# Patient Record
Sex: Female | Born: 1980 | Hispanic: Yes | Marital: Single | Smoking: Former smoker
Health system: Southern US, Community
[De-identification: ages and names within clinical notes are randomized; demographics above are authoritative.]

---

## 2004-09-28 ENCOUNTER — Emergency Department: Payer: Self-pay | Admitting: Emergency Medicine

## 2004-09-29 ENCOUNTER — Ambulatory Visit: Payer: Self-pay | Admitting: Emergency Medicine

## 2005-02-19 ENCOUNTER — Observation Stay: Payer: Self-pay

## 2005-03-22 ENCOUNTER — Observation Stay: Payer: Self-pay | Admitting: Obstetrics and Gynecology

## 2005-04-03 ENCOUNTER — Observation Stay: Payer: Self-pay

## 2005-04-10 ENCOUNTER — Observation Stay: Payer: Self-pay | Admitting: Obstetrics and Gynecology

## 2005-12-08 ENCOUNTER — Emergency Department: Payer: Self-pay | Admitting: Internal Medicine

## 2006-06-27 ENCOUNTER — Emergency Department: Payer: Self-pay | Admitting: Emergency Medicine

## 2006-08-03 ENCOUNTER — Ambulatory Visit: Payer: Self-pay | Admitting: Internal Medicine

## 2006-08-25 ENCOUNTER — Encounter: Payer: Self-pay | Admitting: Family Medicine

## 2006-08-31 ENCOUNTER — Encounter: Payer: Self-pay | Admitting: Family Medicine

## 2006-09-03 ENCOUNTER — Emergency Department: Payer: Self-pay | Admitting: Emergency Medicine

## 2007-11-22 ENCOUNTER — Ambulatory Visit: Payer: Self-pay | Admitting: Family Medicine

## 2008-03-01 ENCOUNTER — Emergency Department: Payer: Self-pay | Admitting: Emergency Medicine

## 2008-03-01 ENCOUNTER — Other Ambulatory Visit: Payer: Self-pay

## 2008-03-20 ENCOUNTER — Emergency Department: Payer: Self-pay | Admitting: Emergency Medicine

## 2008-10-09 ENCOUNTER — Emergency Department: Payer: Self-pay | Admitting: Emergency Medicine

## 2009-07-09 ENCOUNTER — Emergency Department: Payer: Self-pay | Admitting: Emergency Medicine

## 2009-11-01 ENCOUNTER — Emergency Department: Payer: Self-pay | Admitting: Internal Medicine

## 2010-02-13 ENCOUNTER — Emergency Department: Payer: Self-pay | Admitting: Unknown Physician Specialty

## 2010-03-06 ENCOUNTER — Emergency Department: Payer: Self-pay | Admitting: Emergency Medicine

## 2010-06-06 ENCOUNTER — Ambulatory Visit: Payer: Self-pay | Admitting: Unknown Physician Specialty

## 2010-06-17 ENCOUNTER — Ambulatory Visit: Payer: Self-pay | Admitting: Unknown Physician Specialty

## 2010-06-18 LAB — PATHOLOGY REPORT

## 2010-06-20 ENCOUNTER — Emergency Department: Payer: Self-pay | Admitting: Emergency Medicine

## 2010-06-23 ENCOUNTER — Emergency Department: Payer: Self-pay | Admitting: Unknown Physician Specialty

## 2010-11-25 ENCOUNTER — Emergency Department: Payer: Self-pay | Admitting: Emergency Medicine

## 2011-02-17 ENCOUNTER — Emergency Department: Payer: Self-pay | Admitting: Emergency Medicine

## 2011-03-09 ENCOUNTER — Encounter: Payer: Self-pay | Admitting: Family Medicine

## 2011-04-02 ENCOUNTER — Encounter: Payer: Self-pay | Admitting: Family Medicine

## 2011-11-02 ENCOUNTER — Emergency Department: Payer: Self-pay | Admitting: Unknown Physician Specialty

## 2011-11-05 ENCOUNTER — Ambulatory Visit: Payer: Self-pay | Admitting: Internal Medicine

## 2012-02-23 ENCOUNTER — Emergency Department: Payer: Self-pay | Admitting: *Deleted

## 2013-10-26 ENCOUNTER — Emergency Department: Payer: Self-pay | Admitting: Emergency Medicine

## 2016-01-09 ENCOUNTER — Other Ambulatory Visit: Payer: Self-pay | Admitting: Family Medicine

## 2016-01-10 ENCOUNTER — Other Ambulatory Visit: Payer: Self-pay | Admitting: Family Medicine

## 2016-01-10 DIAGNOSIS — R102 Pelvic and perineal pain: Secondary | ICD-10-CM

## 2016-01-13 ENCOUNTER — Ambulatory Visit
Admission: RE | Admit: 2016-01-13 | Discharge: 2016-01-13 | Disposition: A | Discharge status: Home / Self Care | Payer: BLUE CROSS/BLUE SHIELD | Source: Ambulatory Visit | Attending: Family Medicine | Admitting: Family Medicine

## 2016-01-13 DIAGNOSIS — R938 Abnormal findings on diagnostic imaging of other specified body structures: Secondary | ICD-10-CM | POA: Diagnosis not present

## 2016-01-13 DIAGNOSIS — R102 Pelvic and perineal pain: Secondary | ICD-10-CM

## 2016-01-13 DIAGNOSIS — R1032 Left lower quadrant pain: Secondary | ICD-10-CM | POA: Insufficient documentation

## 2016-06-10 ENCOUNTER — Other Ambulatory Visit: Payer: Self-pay | Admitting: Family Medicine

## 2016-06-10 DIAGNOSIS — Z3481 Encounter for supervision of other normal pregnancy, first trimester: Secondary | ICD-10-CM

## 2016-06-10 DIAGNOSIS — R102 Pelvic and perineal pain: Secondary | ICD-10-CM

## 2016-06-19 ENCOUNTER — Ambulatory Visit
Admission: RE | Admit: 2016-06-19 | Discharge: 2016-06-19 | Disposition: A | Discharge status: Home / Self Care | Payer: Self-pay | Source: Ambulatory Visit | Attending: Family Medicine | Admitting: Family Medicine

## 2016-06-19 DIAGNOSIS — R102 Pelvic and perineal pain: Secondary | ICD-10-CM

## 2016-06-19 DIAGNOSIS — Z3481 Encounter for supervision of other normal pregnancy, first trimester: Secondary | ICD-10-CM | POA: Insufficient documentation

## 2016-06-19 DIAGNOSIS — Z3A11 11 weeks gestation of pregnancy: Secondary | ICD-10-CM | POA: Insufficient documentation

## 2017-05-12 ENCOUNTER — Emergency Department: Payer: Medicaid Other

## 2017-05-12 ENCOUNTER — Emergency Department
Admission: EM | Admit: 2017-05-12 | Discharge: 2017-05-12 | Disposition: A | Discharge status: Home / Self Care | Payer: Medicaid Other | Attending: Emergency Medicine | Admitting: Emergency Medicine

## 2017-05-12 ENCOUNTER — Other Ambulatory Visit: Payer: Self-pay

## 2017-05-12 ENCOUNTER — Encounter: Payer: Self-pay | Admitting: Emergency Medicine

## 2017-05-12 DIAGNOSIS — Y929 Unspecified place or not applicable: Secondary | ICD-10-CM | POA: Insufficient documentation

## 2017-05-12 DIAGNOSIS — Y9301 Activity, walking, marching and hiking: Secondary | ICD-10-CM | POA: Insufficient documentation

## 2017-05-12 DIAGNOSIS — S8391XA Sprain of unspecified site of right knee, initial encounter: Secondary | ICD-10-CM

## 2017-05-12 DIAGNOSIS — W000XXA Fall on same level due to ice and snow, initial encounter: Secondary | ICD-10-CM | POA: Insufficient documentation

## 2017-05-12 DIAGNOSIS — W19XXXA Unspecified fall, initial encounter: Secondary | ICD-10-CM

## 2017-05-12 DIAGNOSIS — Y990 Civilian activity done for income or pay: Secondary | ICD-10-CM | POA: Insufficient documentation

## 2017-05-12 DIAGNOSIS — S8991XA Unspecified injury of right lower leg, initial encounter: Secondary | ICD-10-CM | POA: Diagnosis present

## 2017-05-12 MED ORDER — IBUPROFEN 600 MG PO TABS: 600.0000 mg | ORAL_TABLET | Freq: Three times a day (TID) | ORAL | 0 refills | Status: DC | PRN

## 2017-05-12 MED ORDER — IBUPROFEN 600 MG PO TABS
600.0000 mg | ORAL_TABLET | Freq: Once | ORAL | Status: AC
  Administered 2017-05-12: 600 mg via ORAL
  Filled 2017-05-12: qty 1

## 2017-05-12 NOTE — ED Provider Notes (Signed)
[facility] Emergency Department Provider Note  ____________________________________________   First MD Initiated Contact with Patient 05/12/17 [phone]     (approximate)  I have reviewed the triage vital signs and the nursing notes.   HISTORY  Chief Complaint Fall   HPI Valerie Maldonado is a 36 y.o. female who comes to the emergency department with moderate severity sudden onset right knee pain that began when she was at work slipped on ice twisted and fell to the ground.  She did not her head.  She also has mild to moderate aching in her right upper back.  No numbness or weakness.  She is able to ambulate.  Her pain is somewhat worse with movement and somewhat improved with rest.  History reviewed. No pertinent past medical history.  There are no active problems to display for this patient.   History reviewed. No pertinent surgical history.  Prior to Admission medications   Medication Sig Start Date End Date Taking? Authorizing Provider  ibuprofen (ADVIL,MOTRIN) 600 MG tablet Take 1 tablet (600 mg total) by mouth every 8 (eight) hours as needed. 05/12/17   Merrily Brittleifenbark, Katasha Riga, MD    Allergies Penicillins  No family history on file.  Social History Social History   Tobacco Use  . Smoking status: Never Smoker  . Smokeless tobacco: Never Used  Substance Use Topics  . Alcohol use: No    Frequency: Never  . Drug use: Not on file    Review of Systems Constitutional: No fever/chills ENT: No sore throat. Cardiovascular: Denies chest pain. Respiratory: Denies shortness of breath. Gastrointestinal: No abdominal pain.  No nausea, no vomiting.  No diarrhea.  No constipation. Musculoskeletal: Positive for back pain. Neurological: Negative for headaches   ____________________________________________   PHYSICAL EXAM:  VITAL SIGNS: ED Triage Vitals  Enc Vitals Group     BP 05/12/17 0258 121/74     Pulse Rate 05/12/17 0258 80     Resp  05/12/17 0258 20     Temp 05/12/17 0258 98 F (36.7 C)     Temp Source 05/12/17 0258 Oral     SpO2 05/12/17 0258 100 %     Weight 05/12/17 0256 240 lb (108.9 kg)     Height 05/12/17 0256 5\' 1"  (1.549 m)     Head Circumference --      Peak Flow --      Pain Score 05/12/17 0256 5     Pain Loc --      Pain Edu? --      Excl. in GC? --     Constitutional: Alert and oriented x4 pleasant cooperative speaks in full sentences no diaphoresis Head: Atraumatic. Nose: No congestion/rhinnorhea. Mouth/Throat: No trismus Neck: No stridor.   Cardiovascular: Regular rate and rhythm Respiratory: Normal respiratory effort.  No retractions. MSK: Abrasion over right knee extensor mechanism intact knee stable no effusion.  No midline back tenderness Neurologic:  Normal speech and language. No gross focal neurologic deficits are appreciated.  Skin:  Skin is warm, dry and intact. No rash noted.    ____________________________________________  LABS (all labs ordered are listed, but only abnormal results are displayed)  Labs Reviewed - No data to display   __________________________________________  EKG   ____________________________________________  RADIOLOGY  X-ray of the knee reviewed by me with no acute disease ____________________________________________   DIFFERENTIAL includes but not limited to  Knee sprain, knee dislocation, knee fracture, back strain   PROCEDURES  Procedure(s) performed: no  Procedures  Critical Care performed: no  Observation: no ____________________________________________   INITIAL IMPRESSION / ASSESSMENT AND PLAN / ED COURSE  Pertinent labs & imaging results that were available during my care of the patient were reviewed by me and considered in my medical decision making (see chart for details).  The patient arrives very well-appearing neurologically intact with abrasion to the right knee.  Knee stable and x-ray negative.  We will treat her  symptomatically with nonsteroidals refer her back to primary care.  She is discharged home in improved condition verbalized understanding and agreement with plan      ____________________________________________   FINAL CLINICAL IMPRESSION(S) / ED DIAGNOSES  Final diagnoses:  Fall, initial encounter  Sprain of right knee, unspecified ligament, initial encounter      NEW MEDICATIONS STARTED DURING THIS VISIT:  This SmartLink is deprecated. Use AVSMEDLIST instead to display the medication list for a patient.   Note:  This document was prepared using Dragon voice recognition software and may include unintentional dictation errors.      Merrily Brittleifenbark, Jailee Jaquez, MD 05/12/17 [phone]

## 2017-05-12 NOTE — ED Notes (Signed)
Worker's Comp Urine drug screen done by this EDT.

## 2017-05-12 NOTE — Discharge Instructions (Signed)
Please take ibuprofen 3 times a day as needed for pain and follow-up with your primary care physician as needed.  Return to the emergency department for any concerns.  It was a pleasure to take care of you today, and thank you for coming to our emergency department.  If you have any questions or concerns before leaving please ask the nurse to grab me and I'm more than happy to go through your aftercare instructions again.  If you were prescribed any opioid pain medication today such as Norco, Vicodin, Percocet, morphine, hydrocodone, or oxycodone please make sure you do not drive when you are taking this medication as it can alter your ability to drive safely.  If you have any concerns once you are home that you are not improving or are in fact getting worse before you can make it to your follow-up appointment, please do not hesitate to call 911 and come back for further evaluation.  Merrily BrittleNeil Anai Lipson, MD  No results found for this or any previous visit. Dg Knee Complete 4 Views Right  Result Date: 05/12/2017 CLINICAL DATA:  Slip and fall injury on ice, with persistent pain. EXAM: RIGHT KNEE - COMPLETE 4+ VIEW COMPARISON:  None. FINDINGS: No evidence of fracture, dislocation, or joint effusion. No evidence of arthropathy or other focal bone abnormality. Soft tissues are unremarkable. IMPRESSION: Negative. Electronically Signed   By: Ellery Plunkaniel R Mitchell M.D.   On: 05/12/2017 03:43

## 2017-05-12 NOTE — ED Triage Notes (Addendum)
Patient ambulatory to triage with steady gait, without difficulty or distress noted; pt reports falling at work on ice ([employer]); abrasion to right knee; c/o pain to lower back and right knee; st muscles feel "tense"; workers comp profile indicates UDS required; EDT, [place] to triage to complete

## 2017-11-08 ENCOUNTER — Emergency Department
Admission: EM | Admit: 2017-11-08 | Discharge: 2017-11-08 | Disposition: A | Discharge status: Home / Self Care | Payer: Medicaid Other | Attending: Emergency Medicine | Admitting: Emergency Medicine

## 2017-11-08 ENCOUNTER — Emergency Department: Payer: Medicaid Other

## 2017-11-08 ENCOUNTER — Other Ambulatory Visit: Payer: Self-pay

## 2017-11-08 DIAGNOSIS — R109 Unspecified abdominal pain: Secondary | ICD-10-CM | POA: Diagnosis not present

## 2017-11-08 DIAGNOSIS — M549 Dorsalgia, unspecified: Secondary | ICD-10-CM | POA: Diagnosis present

## 2017-11-08 DIAGNOSIS — F17210 Nicotine dependence, cigarettes, uncomplicated: Secondary | ICD-10-CM | POA: Diagnosis not present

## 2017-11-08 DIAGNOSIS — B029 Zoster without complications: Secondary | ICD-10-CM | POA: Insufficient documentation

## 2017-11-08 LAB — CBC WITH DIFFERENTIAL/PLATELET
Basophils Absolute: 0 10*3/uL (ref 0–0.1)
Basophils Relative: 0 %
Eosinophils Absolute: 0.2 10*3/uL (ref 0–0.7)
Eosinophils Relative: 2 %
HEMATOCRIT: 43.4 % (ref 35.0–47.0)
Hemoglobin: 14.9 g/dL (ref 12.0–16.0)
LYMPHS ABS: 2.6 10*3/uL (ref 1.0–3.6)
LYMPHS PCT: 27 %
MCH: 30.7 pg (ref 26.0–34.0)
MCHC: 34.3 g/dL (ref 32.0–36.0)
MCV: 89.5 fL (ref 80.0–100.0)
MONO ABS: 0.6 10*3/uL (ref 0.2–0.9)
Monocytes Relative: 6 %
Neutro Abs: 6.3 10*3/uL (ref 1.4–6.5)
Neutrophils Relative %: 65 %
Platelets: 271 10*3/uL (ref 150–440)
RBC: 4.85 MIL/uL (ref 3.80–5.20)
RDW: 13.6 % (ref 11.5–14.5)
WBC: 9.7 10*3/uL (ref 3.6–11.0)

## 2017-11-08 LAB — COMPREHENSIVE METABOLIC PANEL
ALK PHOS: 91 U/L (ref 38–126)
ALT: 19 U/L (ref 14–54)
AST: 20 U/L (ref 15–41)
Albumin: 4.1 g/dL (ref 3.5–5.0)
Anion gap: 6 (ref 5–15)
BUN: 7 mg/dL (ref 6–20)
CALCIUM: 9.1 mg/dL (ref 8.9–10.3)
CHLORIDE: 105 mmol/L (ref 101–111)
CO2: 26 mmol/L (ref 22–32)
CREATININE: 0.53 mg/dL (ref 0.44–1.00)
Glucose, Bld: 112 mg/dL — ABNORMAL HIGH (ref 65–99)
Potassium: 4.2 mmol/L (ref 3.5–5.1)
Sodium: 137 mmol/L (ref 135–145)
TOTAL PROTEIN: 7.6 g/dL (ref 6.5–8.1)
Total Bilirubin: 0.5 mg/dL (ref 0.3–1.2)

## 2017-11-08 LAB — URINALYSIS, COMPLETE (UACMP) WITH MICROSCOPIC
Bilirubin Urine: NEGATIVE
GLUCOSE, UA: NEGATIVE mg/dL
HGB URINE DIPSTICK: NEGATIVE
Ketones, ur: NEGATIVE mg/dL
NITRITE: NEGATIVE
PH: 5 (ref 5.0–8.0)
Protein, ur: NEGATIVE mg/dL
SPECIFIC GRAVITY, URINE: 1.015 (ref 1.005–1.030)

## 2017-11-08 LAB — POCT PREGNANCY, URINE: Preg Test, Ur: NEGATIVE

## 2017-11-08 MED ORDER — HYDROCODONE-ACETAMINOPHEN 5-325 MG PO TABS
2.0000 | ORAL_TABLET | Freq: Once | ORAL | Status: AC
Start: 2017-11-08 — End: 2017-11-08
  Administered 2017-11-08: 2 via ORAL
  Filled 2017-11-08: qty 2

## 2017-11-08 MED ORDER — HYDROCODONE-ACETAMINOPHEN 5-325 MG PO TABS: 1.0000 | ORAL_TABLET | Freq: Four times a day (QID) | ORAL | 0 refills | Status: DC | PRN

## 2017-11-08 MED ORDER — GABAPENTIN 300 MG PO CAPS
900.0000 mg | ORAL_CAPSULE | Freq: Once | ORAL | Status: AC
  Administered 2017-11-08: 900 mg via ORAL
  Filled 2017-11-08: qty 3

## 2017-11-08 MED ORDER — IBUPROFEN 600 MG PO TABS
600.0000 mg | ORAL_TABLET | Freq: Once | ORAL | Status: AC
  Administered 2017-11-08: 600 mg via ORAL
  Filled 2017-11-08: qty 1

## 2017-11-08 MED ORDER — ACYCLOVIR 400 MG PO TABS: 800.0000 mg | ORAL_TABLET | Freq: Every day | ORAL | 0 refills | Status: AC

## 2017-11-08 MED ORDER — ACYCLOVIR 200 MG PO CAPS
800.0000 mg | ORAL_CAPSULE | Freq: Once | ORAL | Status: AC
  Administered 2017-11-08: 800 mg via ORAL
  Filled 2017-11-08: qty 4

## 2017-11-08 NOTE — Discharge Instructions (Signed)
Please take your antiviral medication as prescribed for full 7 days and follow-up with your primary care physician within 2 days for reevaluation.  Return to the emergency department sooner for any new or worsening symptoms such as fevers, chills, worsening pain, or for any other issues whatsoever.  It was a pleasure to take care of you today, and thank you for coming to our emergency department.  If you have any questions or concerns before leaving please ask the nurse to grab me and I'm more than happy to go through your aftercare instructions again.  If you were prescribed any opioid pain medication today such as Norco, Vicodin, Percocet, morphine, hydrocodone, or oxycodone please make sure you do not drive when you are taking this medication as it can alter your ability to drive safely.  If you have any concerns once you are home that you are not improving or are in fact getting worse before you can make it to your follow-up appointment, please do not hesitate to call 911 and come back for further evaluation.  Merrily BrittleNeil Arianis Bowditch, MD  Results for orders placed or performed during the hospital encounter of 11/08/17  Comprehensive metabolic panel  Result Value Ref Range   Sodium 137 135 - 145 mmol/L   Potassium 4.2 3.5 - 5.1 mmol/L   Chloride 105 101 - 111 mmol/L   CO2 26 22 - 32 mmol/L   Glucose, Bld 112 (H) 65 - 99 mg/dL   BUN 7 6 - 20 mg/dL   Creatinine, Ser 0.450.53 0.44 - 1.00 mg/dL   Calcium 9.1 8.9 - 40.910.3 mg/dL   Total Protein 7.6 6.5 - 8.1 g/dL   Albumin 4.1 3.5 - 5.0 g/dL   AST 20 15 - 41 U/L   ALT 19 14 - 54 U/L   Alkaline Phosphatase 91 38 - 126 U/L   Total Bilirubin 0.5 0.3 - 1.2 mg/dL   GFR calc non Af Amer >60 >60 mL/min   GFR calc Af Amer >60 >60 mL/min   Anion gap 6 5 - 15  Urinalysis, Complete w Microscopic  Result Value Ref Range   Color, Urine YELLOW (A) YELLOW   APPearance HAZY (A) CLEAR   Specific Gravity, Urine 1.015 1.005 - 1.030   pH 5.0 5.0 - 8.0   Glucose, UA  NEGATIVE NEGATIVE mg/dL   Hgb urine dipstick NEGATIVE NEGATIVE   Bilirubin Urine NEGATIVE NEGATIVE   Ketones, ur NEGATIVE NEGATIVE mg/dL   Protein, ur NEGATIVE NEGATIVE mg/dL   Nitrite NEGATIVE NEGATIVE   Leukocytes, UA TRACE (A) NEGATIVE   RBC / HPF 0-5 0 - 5 RBC/hpf   WBC, UA 0-5 0 - 5 WBC/hpf   Bacteria, UA RARE (A) NONE SEEN   Squamous Epithelial / LPF 0-5 0 - 5   Mucus PRESENT   CBC with Differential  Result Value Ref Range   WBC 9.7 3.6 - 11.0 K/uL   RBC 4.85 3.80 - 5.20 MIL/uL   Hemoglobin 14.9 12.0 - 16.0 g/dL   HCT 81.143.4 91.435.0 - 78.247.0 %   MCV 89.5 80.0 - 100.0 fL   MCH 30.7 26.0 - 34.0 pg   MCHC 34.3 32.0 - 36.0 g/dL   RDW 95.613.6 21.311.5 - 08.614.5 %   Platelets 271 150 - 440 K/uL   Neutrophils Relative % 65 %   Neutro Abs 6.3 1.4 - 6.5 K/uL   Lymphocytes Relative 27 %   Lymphs Abs 2.6 1.0 - 3.6 K/uL   Monocytes Relative 6 %  Monocytes Absolute 0.6 0.2 - 0.9 K/uL   Eosinophils Relative 2 %   Eosinophils Absolute 0.2 0 - 0.7 K/uL   Basophils Relative 0 %   Basophils Absolute 0.0 0 - 0.1 K/uL  Pregnancy, urine POC  Result Value Ref Range   Preg Test, Ur NEGATIVE NEGATIVE   Ct Renal Stone Study  Result Date: 11/08/2017 CLINICAL DATA:  Right flank pain.  Nausea. EXAM: CT ABDOMEN AND PELVIS WITHOUT CONTRAST TECHNIQUE: Multidetector CT imaging of the abdomen and pelvis was performed following the standard protocol without IV contrast. COMPARISON:  None. FINDINGS: Lower chest: Normal. Hepatobiliary: No focal liver abnormality is seen. No gallstones, gallbladder wall thickening, or biliary dilatation. Pancreas: Unremarkable. No pancreatic ductal dilatation or surrounding inflammatory changes. Spleen: Normal in size without focal abnormality. Adrenals/Urinary Tract: Adrenal glands are unremarkable. Kidneys are normal, without renal calculi, focal lesion, or hydronephrosis. Bladder is unremarkable. Stomach/Bowel: Stomach is within normal limits. Appendix appears normal. No evidence of  bowel wall thickening, distention, or inflammatory changes. Vascular/Lymphatic: No significant vascular findings are present. No enlarged abdominal or pelvic lymph nodes. Reproductive: 16 mm low-density lesion on the left ovary, probably a follicular cyst. Right ovary and uterus appear normal. Other: No free air or free fluid.  No abdominal wall hernia. Musculoskeletal: No acute or significant osseous findings. IMPRESSION: Benign-appearing abdomen and pelvis. Electronically Signed   By: Francene Boyers M.D.   On: 11/08/2017 17:18

## 2017-11-08 NOTE — ED Triage Notes (Addendum)
Pt reports right side pain that radiates into back - pt states that the pain is worse when pushing down (ex. On the gas pedal) and when walking - pt c/o nausea

## 2017-11-08 NOTE — ED Provider Notes (Signed)
[facility] Emergency Department Provider Note  ____________________________________________   First MD Initiated Contact with Patient 11/08/17 1616     (approximate)  I have reviewed the triage vital signs and the nursing notes.   HISTORY  Chief Complaint Flank Pain   HPI Valerie Maldonado is a 37 y.o. female who self presents to the emergency department with roughly 1 day of constant burning aching severe pain that begins in her right mid back wrapping around like a band terminating under her right rib cage.  The pain is completely constant.  Nothing seems to make it better or worse.  No nausea or vomiting.  No diarrhea.  No dysuria frequency or hesitancy.  She has difficulty finding a comfortable position.  She has no history of abdominal surgeries.  It does not particularly itch.  She is noted no rash.  No history of renal stones.  History reviewed. No pertinent past medical history.  There are no active problems to display for this patient.   History reviewed. No pertinent surgical history.  Prior to Admission medications   Medication Sig Start Date End Date Taking? Authorizing Provider  acyclovir (ZOVIRAX) 400 MG tablet Take 2 tablets (800 mg total) by mouth 5 (five) times daily for 7 days. 11/08/17 11/15/17  Merrily Brittle, MD  HYDROcodone-acetaminophen (NORCO) 5-325 MG tablet Take 1 tablet by mouth every 6 (six) hours as needed for up to 15 doses for severe pain. 11/08/17   Merrily Brittle, MD  ibuprofen (ADVIL,MOTRIN) 600 MG tablet Take 1 tablet (600 mg total) by mouth every 8 (eight) hours as needed. 05/12/17   Merrily Brittle, MD    Allergies Penicillins  No family history on file.  Social History Social History   Tobacco Use  . Smoking status: Current Every Day Smoker    Packs/day: 0.50    Types: Cigarettes  . Smokeless tobacco: Never Used  Substance Use Topics  . Alcohol use: Yes    Frequency: Never    Comment: socially  .  Drug use: Never    Review of Systems Constitutional: No fever/chills Eyes: No visual changes. ENT: No sore throat. Cardiovascular: Denies chest pain. Respiratory: Denies shortness of breath. Gastrointestinal: Positive for abdominal pain.  No nausea, no vomiting.  No diarrhea.  No constipation. Genitourinary: Negative for dysuria. Musculoskeletal: Positive for back pain. Skin: Negative for rash. Neurological: Negative for headaches, focal weakness or numbness.   ____________________________________________   PHYSICAL EXAM:  VITAL SIGNS: ED Triage Vitals  Enc Vitals Group     BP 11/08/17 1444 (!) 110/59     Pulse Rate 11/08/17 1444 60     Resp 11/08/17 1444 17     Temp 11/08/17 1444 98.4 F (36.9 C)     Temp Source 11/08/17 1444 Oral     SpO2 11/08/17 1444 98 %     Weight 11/08/17 1445 230 lb (104.3 kg)     Height 11/08/17 1445 5\' 4"  (1.626 m)     Head Circumference --      Peak Flow --      Pain Score 11/08/17 1444 9     Pain Loc --      Pain Edu? --      Excl. in GC? --     Constitutional: Alert and oriented x4 lying on her left side appears quite uncomfortable nontoxic no diaphoresis speaks focally sentences Eyes: PERRL EOMI. Head: Atraumatic. Nose: No congestion/rhinnorhea. Mouth/Throat: No trismus Neck: No stridor.   Cardiovascular: Normal rate,  regular rhythm. Grossly normal heart sounds.  Good peripheral circulation. Respiratory: Normal respiratory effort.  No retractions. Lungs CTAB and moving good air Gastrointestinal: Obese abdomen soft nondistended nontender no rebound or guarding no peritonitis no McBurney's initially Rovsing's no costovertebral tenderness Musculoskeletal: No lower extremity edema   Neurologic:  Normal speech and language. No gross focal neurologic deficits are appreciated. Skin:  Skin is warm, dry and intact. No rash noted. Psychiatric: Mood and affect are normal. Speech and behavior are  normal.    ____________________________________________   DIFFERENTIAL includes but not limited to  Biliary colic, cholecystitis, nephrolithiasis, urinary tract infection, pyelonephritis, shingles ____________________________________________   LABS (all labs ordered are listed, but only abnormal results are displayed)  Labs Reviewed  COMPREHENSIVE METABOLIC PANEL - Abnormal; Notable for the following components:      Result Value   Glucose, Bld 112 (*)    All other components within normal limits  URINALYSIS, COMPLETE (UACMP) WITH MICROSCOPIC - Abnormal; Notable for the following components:   Color, Urine YELLOW (*)    APPearance HAZY (*)    Leukocytes, UA TRACE (*)    Bacteria, UA RARE (*)    All other components within normal limits  CBC WITH DIFFERENTIAL/PLATELET  POCT PREGNANCY, URINE    Lab work reviewed by me with no acute disease __________________________________________  EKG   ____________________________________________  RADIOLOGY  CT scan reviewed by me with no acute disease ____________________________________________   PROCEDURES  Procedure(s) performed: no  Procedures  Critical Care performed: no  Observation: no ____________________________________________   INITIAL IMPRESSION / ASSESSMENT AND PLAN / ED COURSE  Pertinent labs & imaging results that were available during my care of the patient were reviewed by me and considered in my medical decision making (see chart for details).        ________----------------------------------------- 6:12 PM on 11/08/2017 -----------------------------------------  I appreciate that the patient does not have any rash right now but her pain is in 1 dermatome radiating from her right back around in the band not crossing midline on the right and is burning and severe and constant.  Lab work unremarkable and CT scan is negative.  I think is most prudent to treat her now for shingles as this time  dependent and give her a 2-day follow-up with her primary care physician.  Strict return precautions have been given and the patient verbalizes understanding and agreement with plan.  I appreciate that she has driven here but she says she can get a ride home so I will give her hydrocodone to help with the severe pain.  ____________________________________   FINAL CLINICAL IMPRESSION(S) / ED DIAGNOSES  Final diagnoses:  Herpes zoster without complication      NEW MEDICATIONS STARTED DURING THIS VISIT:  New Prescriptions   ACYCLOVIR (ZOVIRAX) 400 MG TABLET    Take 2 tablets (800 mg total) by mouth 5 (five) times daily for 7 days.   HYDROCODONE-ACETAMINOPHEN (NORCO) 5-325 MG TABLET    Take 1 tablet by mouth every 6 (six) hours as needed for up to 15 doses for severe pain.     Note:  This document was prepared using Dragon voice recognition software and may include unintentional dictation errors.     Merrily Brittleifenbark, Brelan Hannen, MD 11/08/17 [phone]

## 2017-11-08 NOTE — ED Notes (Signed)
Pt c/o RUQ pain x2 days. Pt denies any n/v or diarrhea.

## 2018-11-15 IMAGING — CT CT RENAL STONE PROTOCOL
2 of 4 series · 17 of 46 positions shown, 19 images · non-contrast
Comparison: None.

CLINICAL DATA: Right flank pain.  Nausea.

EXAM:
CT ABDOMEN AND PELVIS WITHOUT CONTRAST
TECHNIQUE: Multidetector CT imaging of the abdomen and pelvis was performed
following the standard protocol without IV contrast.

[Series 2: stone full standard · axial · 0.77mm/px · z∈[-532,-42]mm · 14 of 108 slices shown, 16 images]
[im 5/108  soft-tissue]
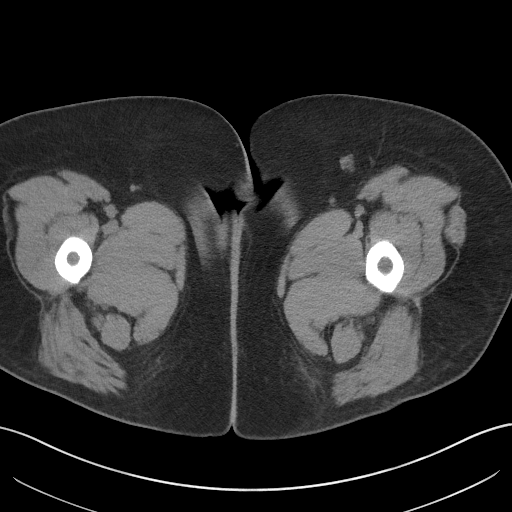
[im 5/108  bone]
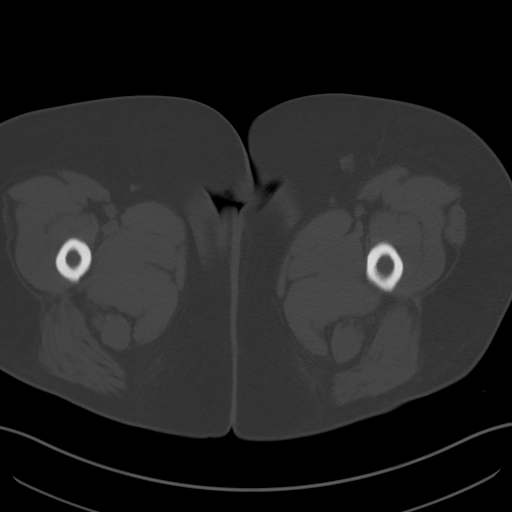
[im 14/108  soft-tissue]
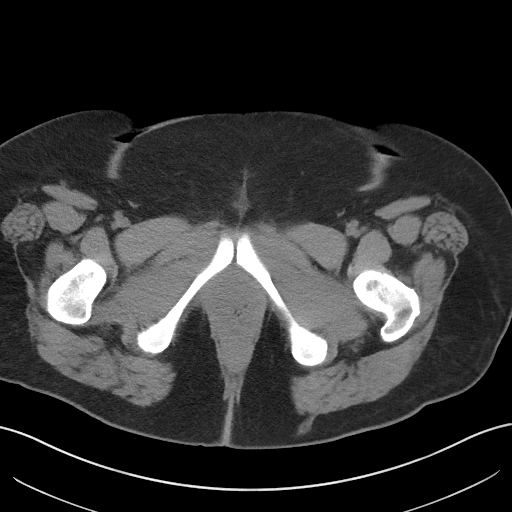
[im 23/108  soft-tissue]
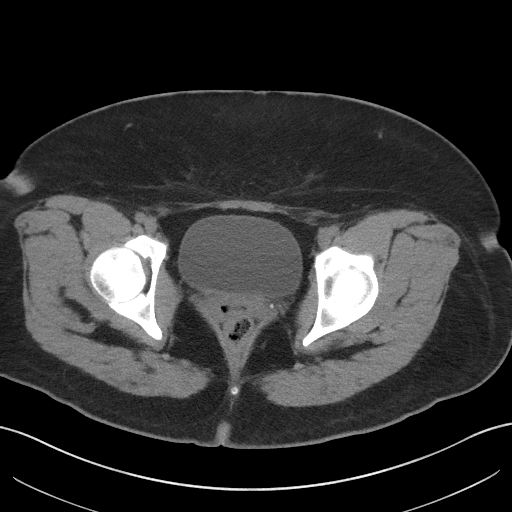
[im 27/108  soft-tissue]
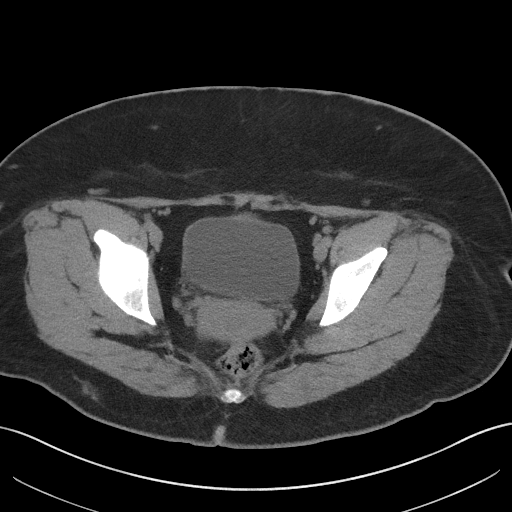
[im 36/108  soft-tissue]
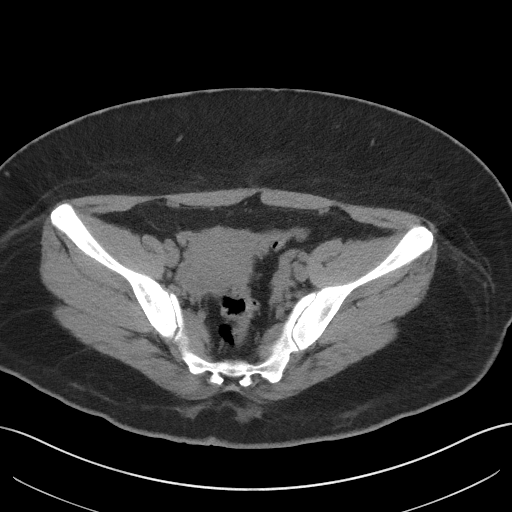
[im 45/108  soft-tissue]
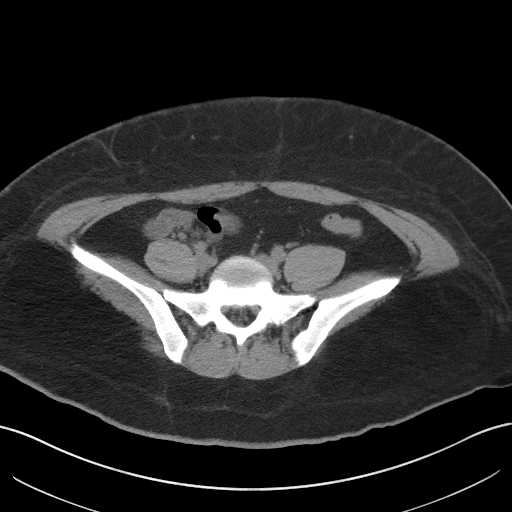
[im 50/108  soft-tissue]
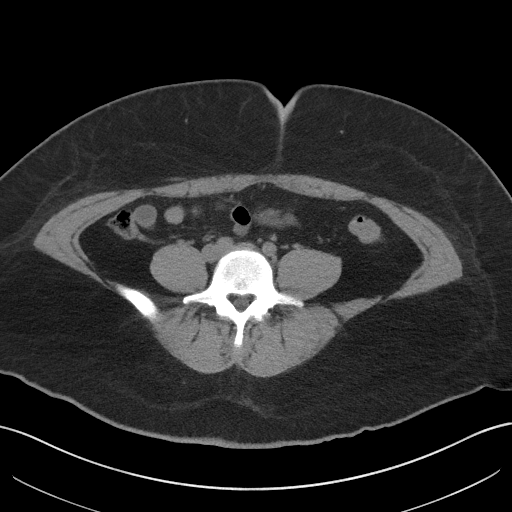
[im 58/108  soft-tissue]
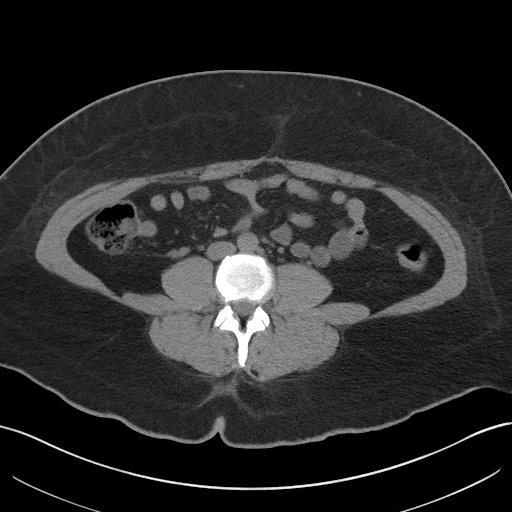
[im 63/108  soft-tissue]
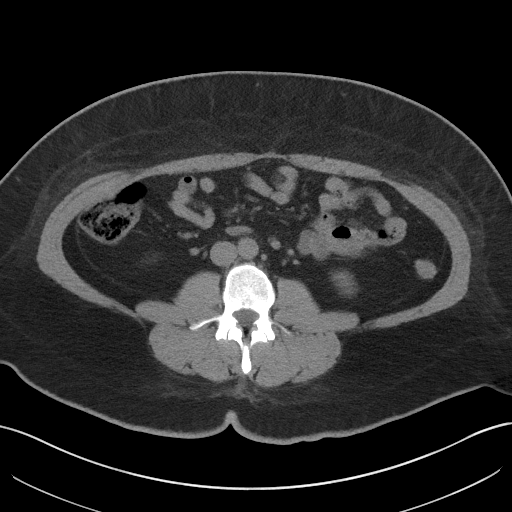
[im 63/108  bone]
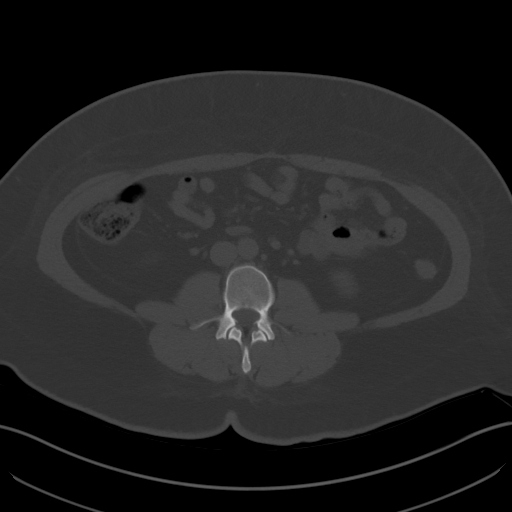
[im 72/108  soft-tissue]
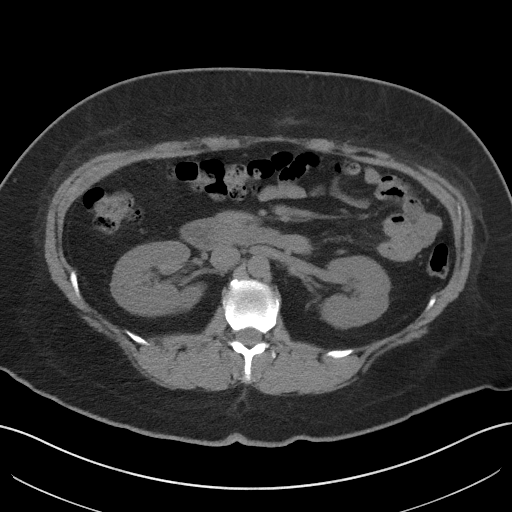
[im 81/108  soft-tissue]
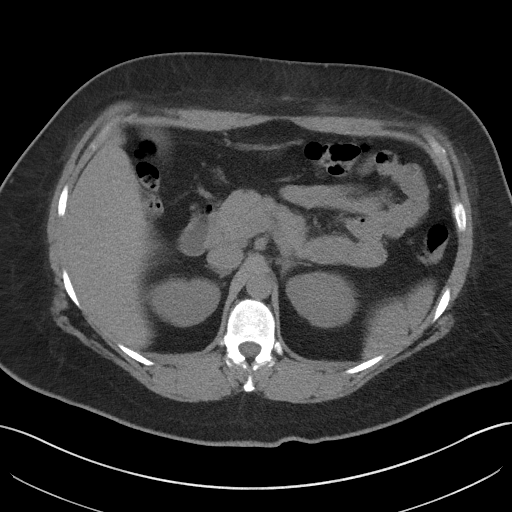
[im 85/108  soft-tissue]
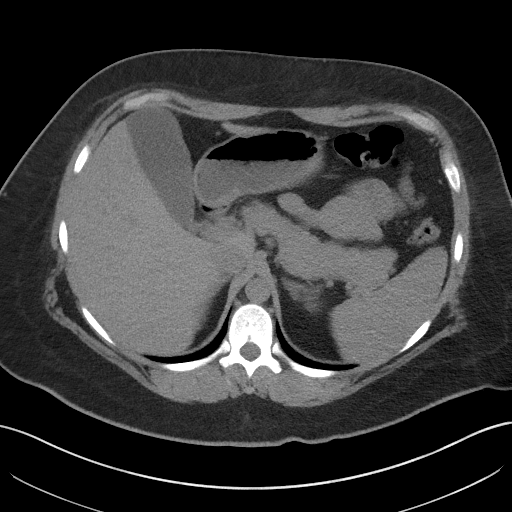
[im 94/108  soft-tissue]
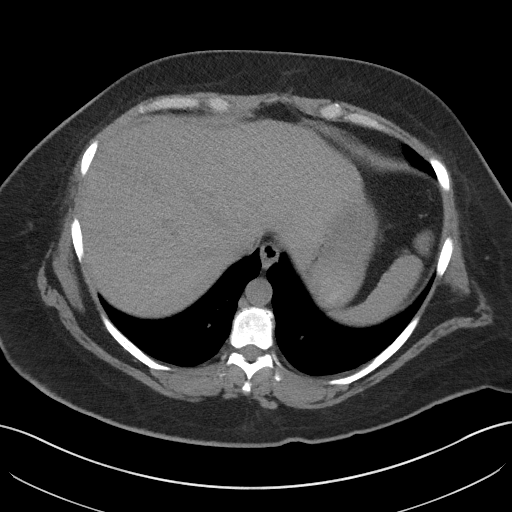
[im 103/108  soft-tissue]
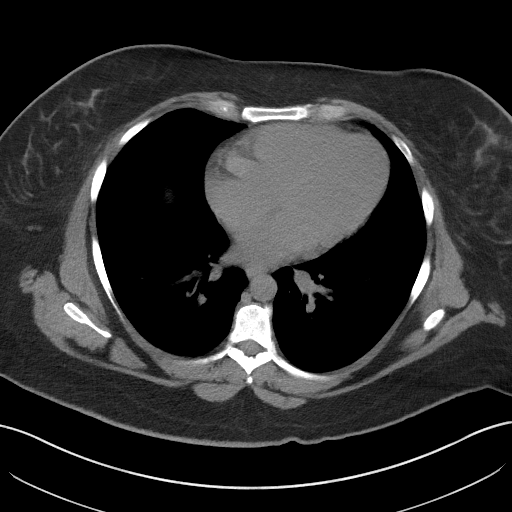

[Series 5: coronal · coronal · 0.95mm/px · 3 of 156 slices shown]
[im 52/156  soft-tissue]
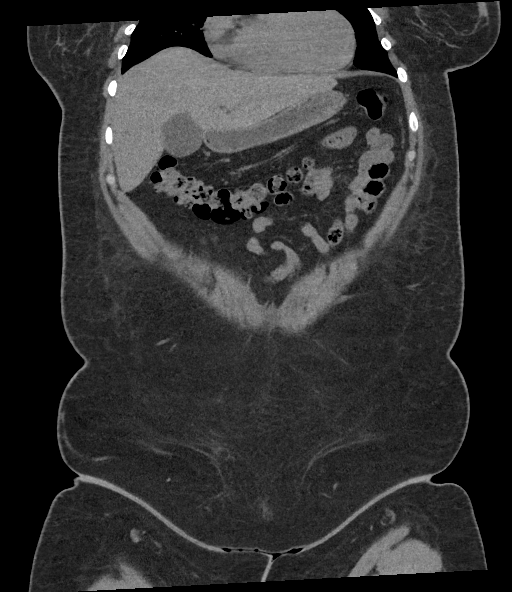
[im 69/156  soft-tissue]
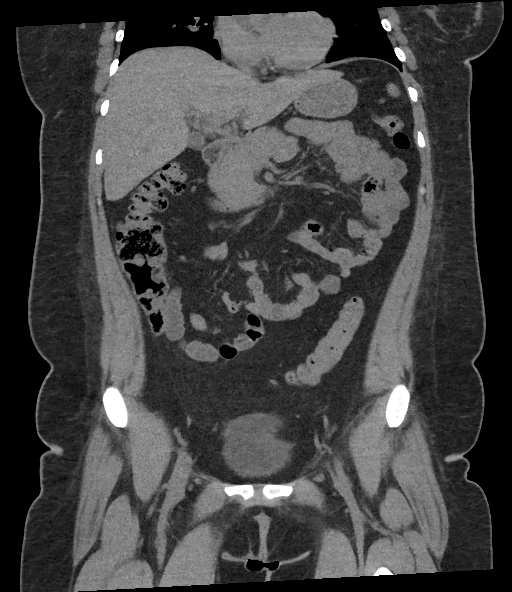
[im 87/156  soft-tissue]
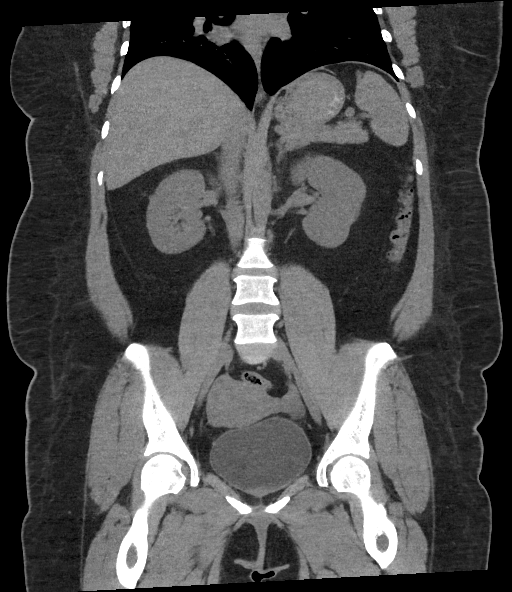

[17 of 46 positions shown; findings below may reference images not displayed]

FINDINGS: Lower chest: Normal.

Hepatobiliary: No focal liver abnormality is seen. No gallstones,
gallbladder wall thickening, or biliary dilatation.

Pancreas: Unremarkable. No pancreatic ductal dilatation or
surrounding inflammatory changes.

Spleen: Normal in size without focal abnormality.

Adrenals/Urinary Tract: Adrenal glands are unremarkable. Kidneys are
normal, without renal calculi, focal lesion, or hydronephrosis.
Bladder is unremarkable.

Stomach/Bowel: Stomach is within normal limits. Appendix appears
normal. No evidence of bowel wall thickening, distention, or
inflammatory changes.

Vascular/Lymphatic: No significant vascular findings are present. No
enlarged abdominal or pelvic lymph nodes.

Reproductive: 16 mm low-density lesion on the left ovary, probably a
follicular cyst. Right ovary and uterus appear normal.

Other: No free air or free fluid.  No abdominal wall hernia.

Musculoskeletal: No acute or significant osseous findings.
IMPRESSION: Benign-appearing abdomen and pelvis.

## 2019-06-20 ENCOUNTER — Ambulatory Visit: Payer: Medicaid Other | Attending: Internal Medicine

## 2019-06-20 DIAGNOSIS — Z20822 Contact with and (suspected) exposure to covid-19: Secondary | ICD-10-CM | POA: Insufficient documentation

## 2019-06-21 LAB — NOVEL CORONAVIRUS, NAA: SARS-CoV-2, NAA: NOT DETECTED

## 2019-06-26 ENCOUNTER — Ambulatory Visit: Payer: Medicaid Other | Attending: Internal Medicine

## 2019-06-26 DIAGNOSIS — Z20822 Contact with and (suspected) exposure to covid-19: Secondary | ICD-10-CM

## 2019-06-27 LAB — NOVEL CORONAVIRUS, NAA: SARS-CoV-2, NAA: NOT DETECTED

## 2020-02-29 ENCOUNTER — Emergency Department
Admission: EM | Admit: 2020-02-29 | Discharge: 2020-02-29 | Disposition: A | Discharge status: Home / Self Care | Payer: Medicaid Other | Attending: Emergency Medicine | Admitting: Emergency Medicine

## 2020-02-29 ENCOUNTER — Encounter: Payer: Self-pay | Admitting: Emergency Medicine

## 2020-02-29 ENCOUNTER — Other Ambulatory Visit: Payer: Self-pay

## 2020-02-29 DIAGNOSIS — S0502XA Injury of conjunctiva and corneal abrasion without foreign body, left eye, initial encounter: Secondary | ICD-10-CM | POA: Insufficient documentation

## 2020-02-29 DIAGNOSIS — F17210 Nicotine dependence, cigarettes, uncomplicated: Secondary | ICD-10-CM | POA: Insufficient documentation

## 2020-02-29 DIAGNOSIS — X58XXXA Exposure to other specified factors, initial encounter: Secondary | ICD-10-CM | POA: Insufficient documentation

## 2020-02-29 MED ORDER — FLUORESCEIN SODIUM 1 MG OP STRP
1.0000 | ORAL_STRIP | Freq: Once | OPHTHALMIC | Status: AC
  Administered 2020-02-29: 20:00:00 1 via OPHTHALMIC
  Filled 2020-02-29: qty 1

## 2020-02-29 MED ORDER — POLYMYXIN B-TRIMETHOPRIM 10000-0.1 UNIT/ML-% OP SOLN: 2.0000 [drp] | Freq: Four times a day (QID) | OPHTHALMIC | 0 refills | Status: AC

## 2020-02-29 MED ORDER — KETOROLAC TROMETHAMINE 0.5 % OP SOLN: 1.0000 [drp] | Freq: Four times a day (QID) | OPHTHALMIC | 0 refills | Status: AC

## 2020-02-29 MED ORDER — TETRACAINE HCL 0.5 % OP SOLN
2.0000 [drp] | Freq: Once | OPHTHALMIC | Status: AC
  Administered 2020-02-29: 20:00:00 2 [drp] via OPHTHALMIC
  Filled 2020-02-29: qty 4

## 2020-02-29 NOTE — ED Triage Notes (Signed)
States she was scratched by her son  Pain to left eye
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
## 2023-09-23 ENCOUNTER — Other Ambulatory Visit: Payer: Self-pay | Admitting: Family Medicine

## 2023-09-23 DIAGNOSIS — Z1231 Encounter for screening mammogram for malignant neoplasm of breast: Secondary | ICD-10-CM

## 2023-11-24 ENCOUNTER — Ambulatory Visit: Admitting: Family Medicine

## 2023-12-30 ENCOUNTER — Other Ambulatory Visit: Payer: Self-pay | Admitting: General Surgery

## 2024-01-05 ENCOUNTER — Ambulatory Visit
Admission: RE | Admit: 2024-01-05 | Discharge: 2024-01-05 | Disposition: A | Discharge status: Home / Self Care | Source: Ambulatory Visit | Attending: General Surgery | Admitting: General Surgery

## 2024-01-05 DIAGNOSIS — E569 Vitamin deficiency, unspecified: Secondary | ICD-10-CM | POA: Diagnosis not present

## 2024-01-05 DIAGNOSIS — E119 Type 2 diabetes mellitus without complications: Secondary | ICD-10-CM | POA: Insufficient documentation

## 2024-01-10 DIAGNOSIS — Z01810 Encounter for preprocedural cardiovascular examination: Secondary | ICD-10-CM | POA: Diagnosis not present

## 2024-01-24 ENCOUNTER — Other Ambulatory Visit: Payer: Self-pay | Admitting: General Surgery

## 2024-01-24 DIAGNOSIS — E119 Type 2 diabetes mellitus without complications: Secondary | ICD-10-CM

## 2024-01-26 ENCOUNTER — Ambulatory Visit
Admission: RE | Admit: 2024-01-26 | Discharge: 2024-01-26 | Disposition: A | Discharge status: Home / Self Care | Source: Home / Self Care | Attending: General Surgery | Admitting: General Surgery

## 2024-01-26 ENCOUNTER — Ambulatory Visit
Admission: RE | Admit: 2024-01-26 | Discharge: 2024-01-26 | Disposition: A | Discharge status: Home / Self Care | Source: Ambulatory Visit | Attending: General Surgery | Admitting: General Surgery

## 2024-01-26 DIAGNOSIS — E119 Type 2 diabetes mellitus without complications: Secondary | ICD-10-CM | POA: Insufficient documentation

## 2024-02-09 ENCOUNTER — Ambulatory Visit: Admitting: Dietician

## 2024-02-21 ENCOUNTER — Ambulatory Visit (HOSPITAL_COMMUNITY): Payer: Self-pay | Admitting: Licensed Clinical Social Worker

## 2024-02-21 DIAGNOSIS — F4320 Adjustment disorder, unspecified: Secondary | ICD-10-CM | POA: Diagnosis not present
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
## 2024-03-13 ENCOUNTER — Encounter: Attending: General Surgery | Admitting: Dietician

## 2024-03-13 ENCOUNTER — Encounter: Payer: Self-pay | Admitting: Dietician

## 2024-03-13 VITALS — Ht 64.0 in | Wt 308.7 lb

## 2024-03-13 DIAGNOSIS — E119 Type 2 diabetes mellitus without complications: Secondary | ICD-10-CM | POA: Insufficient documentation

## 2024-03-13 DIAGNOSIS — E569 Vitamin deficiency, unspecified: Secondary | ICD-10-CM | POA: Diagnosis present

## 2024-03-13 DIAGNOSIS — Z6841 Body Mass Index (BMI) 40.0 and over, adult: Secondary | ICD-10-CM | POA: Diagnosis not present

## 2024-03-13 DIAGNOSIS — E66813 Obesity, class 3: Secondary | ICD-10-CM

## 2024-03-13 DIAGNOSIS — Z713 Dietary counseling and surveillance: Secondary | ICD-10-CM | POA: Insufficient documentation

## 2024-03-13 NOTE — Progress Notes (Signed)
 Nutrition Assessment for Bariatric Surgery   Appointment start time: 1100   end time: 1200  Planned Surgery: RnY gastric bypass  Anthropometrics: Weight: 308.7lbs Height: 5'4 BMI: 52.99    Clinical: Medical History: type 2 Diabetes Medications and Supplements: metFORMIN, Trulicity, adult multivitamin, collagen powder supplement Relevant labs: 12/31/23 HbA1C 7.6%, total cholesterol 148, HDL 46, LDL 88, triglycerides 72; Vitamin D, B12, folate, iron tested and all were normal Notable symptoms: none reported by patient Drug allergies: penecillins Food allergies: shrimp  Lifestyle and Dietary History:  Dieting/ weight history:  went to bariatric clinic which included supplements and HCG injections; lost about 5lbs per month; stopped due to cost and then regained weight has also tried multiple diets without success.  Has been seeing nutritionist monthly at [facility] for several months for weight loss. Has begun losing some weight with Trulicity. Usually preps meals on Sundays to minimize weeknight cooking  Disordered or emotional eating history: none  Support: Sister and coworkers are providing support for patient; coworkers also working on healthy lifestyle habits.  No junk food at work; mostly fruit, veg for snacks  Challenges: Sometimes sleep inadequacy due to son with autism waking up during the night, early am  Physical activity: dance, stepping machine, resistance bands 15-30 minutes, 3-4 times a week    Dietary Recall:  Daily pattern: 3 meals and 1-2 snacks. Dining out: 0-1 meals per week. Breakfast: toast with avocado or ham and/or egg, coffee with creamer Snack: 10am sometimes crackers (whole grain seeded) or granola bar; apple or banana Lunch: mini pizza (homemade) with spinach, ham, tomato sauce, cheese on flatbread; salad with meat, corn + crackers; chicken with chipotle sauce and tostadas; sushi (cooked, not raw) Snack: sometimes smoothie with coconut  water, pineapple, spinach (with coworker) Supper: 1-2 tortillas, avocado, meat or cheese and beans; tortilla wrap or pita with meat, veg; pork chop/ chicken with rice and veg ie broccoli/ other; soup with chicken/ beef, potato, veg Snack: none Beverages: coffee with creamer (sm amt); water, coconut water, sparkling water, sometimes hot tea, sometimes 1/2 soda   Nutrition Intervention: Instructed patient on pre-op diet goals and importance of close adherence to bariatric diet after surgery to avoid side effects and complications and promote healthy metabolism with weight loss.  Discussed stages of the bariatric diet after surgery as well as the importance of adequate protein and fluid intake. Advised checking into options for protein drinks with minimum of 15g protein and 5g or less carbs.  Discussed importance of positive support from others. Discussed changes needed to adapt current eating pattern to post-op guidelines. Provided overview of 2-week pre-op diet.  Instructed on importance of lifelong vitamin supplementation after surgery and suitable options.  Nutrition Diagnosis: [place]-3.3 Overweight/ obesity related to history of excess calories and inadequate physical activity as evidenced by patient with current BMI of 52.99, following dietary guidelines for weight loss prior to bariatric surgery.  Teaching method(s) utilized: Risk manager provided: Getting Ready for Bariatric Surgery packet  Learning Readiness: Change in progress  Barriers to learning/ implementing lifestyle change: none  Demonstrated degree of understanding via: Teach Back  Summary: Patient has begun making diet and lifestyle changes in effort to lose weight and prepare for bariatric surgery.  Patient's goal for weight loss is improved health. She has solid support from family and coworkers.  She agrees to work on reducing caffeine intake and maintaining regular exercise prior to surgery.   She is motivated to follow the bariatric diet  after surgery.  From a nutrition standpoint, she is ready to proceed with the bariatric surgery program and will be a good candidate for sugery.    Plan: Patient commits to returning for pre-op class prior to surgery.  She will plan to return for post-op MNT visits beginning 2 weeks after surgery.

## 2024-03-13 NOTE — Patient Instructions (Signed)
 Work on decreasing caffeine from coffee; make coffee weaker, try half-caff pods, or try decaf coffee.  Keep up regular exercise, great job! Check the amount of protein and sugar/ carbs, flavors, cost in different protein drinks, figure out which ones will work after surgery.

## 2024-05-01 ENCOUNTER — Encounter: Payer: Self-pay | Admitting: Dietician

## 2024-05-01 ENCOUNTER — Encounter: Attending: General Surgery | Admitting: Dietician

## 2024-05-01 VITALS — Ht 64.0 in | Wt 308.8 lb

## 2024-05-01 DIAGNOSIS — E66813 Obesity, class 3: Secondary | ICD-10-CM | POA: Diagnosis present

## 2024-05-01 DIAGNOSIS — Z6841 Body Mass Index (BMI) 40.0 and over, adult: Secondary | ICD-10-CM | POA: Insufficient documentation

## 2024-05-01 DIAGNOSIS — E119 Type 2 diabetes mellitus without complications: Secondary | ICD-10-CM | POA: Diagnosis present

## 2024-05-01 NOTE — Patient Instructions (Addendum)
 Continue with balanced meals and snacks, great job! Keep up regular exercise and moving throughout the day.

## 2024-05-01 NOTE — Progress Notes (Signed)
 Supervised Weight Loss for Bariatric Surgery Appt start time: 1500 end time:  1530  SWL Appointment 2 of 3   Anthropometrics Start Weight at [facility]: 308.7lbs  (03/13/24) Weight: 308.8lbs  Height: 5'4  BMI: 53  Clinical Medications: no changes since previous visit Health/ medical history changes: no changes  Dietary/ Lifestyle Progress: Has begun drinking some Premier protein shakes in place of a meal when not hungry or in a hurry.  Has increased daily physical activity Patient reports she feels she has lost some inches around her abdomen   Dietary intake: Eating Pattern: 3 meals and 1-2 snacks daily Dining out: 0-1 meals per week  Breakfast: toast with avocado or ham and/or egg, coffee with creamer   Snack: 10am sometimes crackers or fruit or granola bar Lunch: homemade min pizza on flatbread; salad with chicken/ other meat + corn, crackers; chicken + veg Snack: sometimes smoothie with coconut water, pineapple, spinach Dinner: zucchini with red sauce and parmesan and sm portion ham or salami; 1-2 tortillas, avocado, meat or cheese and beans; tortilla wrap or pita with meat, veg; pork chop/ chicken with  veg ie broccoli/ other; soup with chicken/ beef, potato, veg  Snack: none Beverages: coffee with creamer (sm amt), water, coconut water, sparkling water, sometimes hot tea   Usual physical activity: dance/ stepping machine, resistance bands 15-20 minutes, 3-4 times per week + climbing steps 10 minutes daily; caring for small son    Intervention:   Nutrition counseling for weight loss prior to upcoming bariatric surgery.               Nutritional Diagnosis:  -3.3 Overweight/obesity related to history of excess calories and inadequate physical activity as evidenced by patient with current BMI of 53, following dietary guidelines for weight loss prior to bariatric surgery.               Teaching Method Utilized:  Visual Auditory Hands on   Learning Readiness:  Change in  progress  Barriers to learning/adherence to lifestyle change: none  Demonstrated degree of understanding via:  Teach Back   Plan:  return 05/15/24 at 3pm

## 2024-05-15 ENCOUNTER — Encounter: Admitting: Dietician

## 2024-05-15 VITALS — Wt 303.9 lb

## 2024-05-15 DIAGNOSIS — E66813 Obesity, class 3: Secondary | ICD-10-CM | POA: Diagnosis not present

## 2024-05-15 DIAGNOSIS — E119 Type 2 diabetes mellitus without complications: Secondary | ICD-10-CM

## 2024-05-15 DIAGNOSIS — Z6841 Body Mass Index (BMI) 40.0 and over, adult: Secondary | ICD-10-CM

## 2024-05-15 NOTE — Patient Instructions (Signed)
 Continue to eat meals and snacks every 3-5 hours during the day, with lean protein + low-carb veggies and small portions of any starches.  If you eat a starchy food, make it whole grain or high fiber most of the time. Keep uo some exercise most days of the week, great job!

## 2024-05-15 NOTE — Progress Notes (Signed)
 Supervised Weight Loss for Bariatric Surgery Appt start time: 1530 end time:  1600.  SWL Appointment 3 of 3   Anthropometrics Start Weight at [facility]: 308.9lbs  (03/13/24) Weight: 303.9lbs  Height: 5'4  BMI: 52.16  Clinical Medications: no changes since previous visit Health/ medical history changes: no changes since previous visit  Dietary/ Lifestyle Progress: Patient reports reducing food portions, pre-planning meals and snacks, working towards pre-op diet pattern Follows people on social media, getting ideas for post-op food choices and meals.  Loss of about 5lbs in past 2 weeks. Has not had trulicity for about 6 weeks due to gap between medical visits. Has resumed as of last week. Did experience increased blood glucose during the weeks without the medication.   Dietary intake: Eating Pattern: 3 meals and 1 snacks daily Dining out: 0-1  Breakfast: toast with avocado or ham and/or egg; small cup coffee some days, sometimes protein shake (partial)  Snack: none or same as pm  Lunch: salad with chicken/ meat + corn, belvita biscuit 1pc (if not drinking protein shake) Snack: occasional smoothie with fruit, spinach + coconut water Dinner: mozzarella with herbs and olive oil, adds ham; meat + zucchini with red sauce; tortilla or pita with meat, veg Snack: none Beverages: mostly water, sparkling water, coconut water, sometimes hot tea  Usual physical activity: climbing stairs daily;     Intervention:   Nutrition counseling for weight loss prior to upcoming bariatric surgery, including portion control, healthy food choices, limiting/ gradually reducing carb intake prior to surgery. Overview of pre-op diet Patient is currently utilizing support group online, and receiving positive support from family. She is actively making positive diet and lifestyle changes to promote weight loss.  She remains a good candidate for weight loss surgery, from nutrition standpoint.                Nutritional Diagnosis:  [place]-3.3 Overweight/obesity related to history of excess calories and inadequate physical activity as evidenced by patient with current BMI of 52, following dietary guidelines for weight loss prior to bariatric surgery.               Teaching Method Utilized:  Visual Auditory Hands on  Materials provided: Visit summary with goals  Learning Readiness:  Change in progress  Barriers to learning/adherence to lifestyle change: none  Demonstrated degree of understanding via:  Teach Back   Plan:  attend pre-op class at least 2 weeks prior to surgery

## 2024-06-12 ENCOUNTER — Encounter: Attending: General Surgery | Admitting: Skilled Nursing Facility1

## 2024-06-12 ENCOUNTER — Other Ambulatory Visit: Payer: Self-pay | Admitting: Family Medicine

## 2024-06-12 ENCOUNTER — Encounter: Payer: Self-pay | Admitting: Skilled Nursing Facility1

## 2024-06-12 VITALS — Wt 308.9 lb

## 2024-06-12 DIAGNOSIS — Z1231 Encounter for screening mammogram for malignant neoplasm of breast: Secondary | ICD-10-CM

## 2024-06-12 DIAGNOSIS — E669 Obesity, unspecified: Secondary | ICD-10-CM | POA: Insufficient documentation

## 2024-06-12 NOTE — Progress Notes (Signed)
 Pre-Operative Nutrition Class:    Class start Time: 5:10   Class End Time: 6:10  This was a class of 4 patients.   Patient was seen on 06/13/2023 for Pre-Operative Bariatric Surgery Education [facility]    Surgery date:  Surgery type: RYGB Start weight at [facility]: 308.9 Weight today: 306.1  Samples given per MNT protocol. Patient educated on appropriate usage: Celebrate Vitamins Multivitamin Lot # [id number] Exp: 09/26   Celebrate Vitamins Calcium  Lot # [id number] Exp: 12/26   Ensure Max Protein Shake Lot # [id number] Exp: 1JUL 2026  The following the learning objectives were met by the patient during this course: Identify Pre-Op Dietary Goals and will begin 2 weeks pre-operatively Identify appropriate sources of fluids and proteins  State protein recommendations and appropriate sources pre and post-operatively Identify Post-Operative Dietary Goals and will follow for 2 weeks post-operatively Identify appropriate multivitamin, calcium, and thiamin sources Describe the need for physical activity post-operatively and will follow MD recommendations State when to call healthcare provider regarding medication questions or post-operative complications When having a diagnosis of diabetes understanding hypoglycemia symptoms and the inclusion of 1 complex carbohydrate per meal  Handouts given during class include: Pre-Op Bariatric Surgery Diet Handout Protein Shake Handout Post-Op Bariatric Surgery Nutrition Handout BEL[facility] Program Information Flyer [employer] Information Flyer [facility] Bariatric Supplements Price List  Follow-Up Plan: Patient will follow-up at [facility] 2 weeks post operatively for diet advancement per MD.
